# Patient Record
Sex: Male | Born: 1965 | Race: White | Hispanic: No | Marital: Married | Smoking: Never smoker
Health system: Southern US, Community
[De-identification: ages and names within clinical notes are randomized; demographics above are authoritative.]

## PROBLEM LIST (undated history)

## (undated) DIAGNOSIS — E785 Hyperlipidemia, unspecified: Secondary | ICD-10-CM

## (undated) HISTORY — DX: Hyperlipidemia, unspecified: E78.5

---

## 2013-09-30 ENCOUNTER — Encounter: Payer: Self-pay | Admitting: Family Medicine

## 2013-09-30 ENCOUNTER — Encounter (INDEPENDENT_AMBULATORY_CARE_PROVIDER_SITE_OTHER): Payer: Self-pay

## 2013-09-30 ENCOUNTER — Ambulatory Visit (INDEPENDENT_AMBULATORY_CARE_PROVIDER_SITE_OTHER): Payer: BC Managed Care – PPO | Admitting: Family Medicine

## 2013-09-30 VITALS — BP 125/85 | HR 60 | Ht 72.0 in | Wt 225.0 lb

## 2013-09-30 DIAGNOSIS — M545 Low back pain, unspecified: Secondary | ICD-10-CM

## 2013-09-30 NOTE — Patient Instructions (Signed)
You have low back pain. This can be from a variety of things - you have known L5-S1 degenerative disc disease.  It's possible you have a bulging disc as well. But these are all treated with the same conservative protocol. Consider prednisone if you develop numbness/tingling, radiation into legs. Aleve 2 tabs twice a day with food as needed. Consider flexeril as needed for muscle spasms (no driving on this medicine if it makes you sleepy). Stay as active as possible. Physical therapy has been shown to be helpful as well - start this and do home exercises on days you don't go to therapy. Strengthening of low back muscles, abdominal musculature are key for long term pain relief. If not improving, will consider further imaging (MRI).  Generic arthritis instructions: Take tylenol 500mg  1-2 tabs three times a day for pain. Aleve 1-2 tabs twice a day with food Glucosamine sulfate 750mg  twice a day is a supplement that may help. Capsaicin topically up to four times a day may also help with pain. Cortisone injections are an option. If cortisone injections do not help, there are different types of shots that may help but they take longer to take effect. It's important that you continue to stay active. Physical therapy as you're going to do. Shoe inserts with good arch support may be helpful. Heat or ice 15 minutes at a time 3-4 times a day as needed to help with pain. Follow up with me in 6 weeks for reevaluation.

## 2013-10-02 ENCOUNTER — Encounter: Payer: Self-pay | Admitting: Family Medicine

## 2013-10-02 DIAGNOSIS — M545 Low back pain, unspecified: Secondary | ICD-10-CM | POA: Insufficient documentation

## 2013-10-02 NOTE — Progress Notes (Signed)
Patient ID: Jeremy FriesKenneth Chung, male   DOB: 09-15-1965, 48 y.o.   MRN: [id number]  PCP: No primary provider on file.  Subjective:   HPI: Patient is a 48 y.o. male here for low back pain.  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 For years has had pain in low back without any trauma or injury. Has seen chiropractors in past - 5-6 years ago dx with sciatica and did well with this, improved for 2 years. Started coming back and chiropractic care didn't help. Is tolerable and has good and bad days. Had x-rays on 10/18/12 showing moderate DDD at L5-S1 level. Has tried icing, exercises, stretching, TENS unit at chiropractor, aleve. Never had MRI, done PT, ESIs, capsaicin.  Past Medical History  Diagnosis Date  . Hyperlipidemia     No current outpatient prescriptions on file prior to visit.   No current facility-administered medications on file prior to visit.    History reviewed. No pertinent past surgical history.  No Known Allergies  History   Social History  . Marital Status: Married    Spouse Name: N/A    Number of Children: N/A  . Years of Education: N/A   Occupational History  . Not on file.   Social History Main Topics  . Smoking status: Never Smoker   . Smokeless tobacco: Not on file  . Alcohol Use: Not on file  . Drug Use: Not on file  . Sexual Activity: Not on file   Other Topics Concern  . Not on file   Social History Narrative  . No narrative on file    Family History  Problem Relation Age of Onset  . Hyperlipidemia Father     BP 125/85  Pulse 60  Ht 6' (1.829 m)  Wt 225 lb (102.059 kg)  BMI 30.51 kg/m2  Review of Systems: See HPI above.    Objective:  Physical Exam:  Gen: NAD  Back: No gross deformity, scoliosis. No focal TTP .  No midline or bony TTP. ROM 10 degrees extension, 70 flexion. Strength LEs 5/5 all muscle groups.   2+ MSRs in patellar and achilles tendons, equal bilaterally. Negative  SLRs. Sensation intact to light touch bilaterally. Negative logroll bilateral hips Negative fabers and piriformis stretches.    Assessment & Plan:  1. Low back pain - known L5-S1 moderate DDD.  May have bulging disc also.  Discussed treatment options - will start with physical therapy and a home exercise program.  Consider further imaging (MRI) if not improving.  Aleve regularly.  Consider prednisone, flexeril, glucosamine, capsaicin.  F/u in 6 weeks.

## 2013-10-02 NOTE — Assessment & Plan Note (Signed)
known L5-S1 moderate DDD.  May have bulging disc also.  Discussed treatment options - will start with physical therapy and a home exercise program.  Consider further imaging (MRI) if not improving.  Aleve regularly.  Consider prednisone, flexeril, glucosamine, capsaicin.  F/u in 6 weeks.

## 2013-10-06 ENCOUNTER — Ambulatory Visit: Payer: BC Managed Care – PPO | Attending: Family Medicine | Admitting: Physical Therapy

## 2013-10-06 DIAGNOSIS — IMO0001 Reserved for inherently not codable concepts without codable children: Secondary | ICD-10-CM | POA: Insufficient documentation

## 2013-10-06 DIAGNOSIS — M545 Low back pain, unspecified: Secondary | ICD-10-CM | POA: Insufficient documentation

## 2013-10-12 ENCOUNTER — Ambulatory Visit: Payer: BC Managed Care – PPO | Admitting: Rehabilitation

## 2013-10-19 ENCOUNTER — Ambulatory Visit: Payer: BC Managed Care – PPO | Admitting: Physical Therapy

## 2013-10-21 ENCOUNTER — Ambulatory Visit: Payer: BC Managed Care – PPO | Admitting: Physical Therapy

## 2013-10-26 ENCOUNTER — Ambulatory Visit: Payer: BC Managed Care – PPO | Admitting: Physical Therapy

## 2013-10-28 ENCOUNTER — Ambulatory Visit: Payer: BC Managed Care – PPO | Admitting: Physical Therapy

## 2013-11-02 ENCOUNTER — Ambulatory Visit: Payer: BC Managed Care – PPO | Attending: Family Medicine | Admitting: Physical Therapy

## 2013-11-02 DIAGNOSIS — M545 Low back pain, unspecified: Secondary | ICD-10-CM | POA: Insufficient documentation

## 2013-11-04 ENCOUNTER — Ambulatory Visit: Payer: BC Managed Care – PPO | Admitting: Rehabilitation

## 2013-11-09 ENCOUNTER — Ambulatory Visit: Payer: BC Managed Care – PPO | Admitting: Physical Therapy

## 2013-11-11 ENCOUNTER — Ambulatory Visit: Payer: BC Managed Care – PPO | Admitting: Rehabilitation

## 2013-11-16 ENCOUNTER — Ambulatory Visit: Payer: BC Managed Care – PPO | Admitting: Physical Therapy

## 2013-11-18 ENCOUNTER — Ambulatory Visit: Payer: BC Managed Care – PPO | Admitting: Rehabilitation

## 2013-11-18 ENCOUNTER — Ambulatory Visit: Payer: BC Managed Care – PPO | Admitting: Family Medicine

## 2013-11-23 ENCOUNTER — Ambulatory Visit: Payer: BC Managed Care – PPO | Admitting: Physical Therapy

## 2013-11-23 ENCOUNTER — Ambulatory Visit: Payer: BC Managed Care – PPO | Admitting: Family Medicine

## 2013-11-30 ENCOUNTER — Ambulatory Visit (INDEPENDENT_AMBULATORY_CARE_PROVIDER_SITE_OTHER): Payer: BC Managed Care – PPO | Admitting: Family Medicine

## 2013-11-30 ENCOUNTER — Ambulatory Visit: Payer: BC Managed Care – PPO | Admitting: Physical Therapy

## 2013-11-30 ENCOUNTER — Encounter: Payer: Self-pay | Admitting: Family Medicine

## 2013-11-30 VITALS — BP 125/83 | HR 67 | Ht 72.0 in | Wt 210.0 lb

## 2013-11-30 DIAGNOSIS — M545 Low back pain, unspecified: Secondary | ICD-10-CM

## 2013-12-01 ENCOUNTER — Encounter: Payer: Self-pay | Admitting: Family Medicine

## 2013-12-01 NOTE — Progress Notes (Addendum)
Patient ID: Jeremy Chung, male   DOB: 1966/06/21, 48 y.o.   MRN: [id number]  PCP: No primary provider on file.  Subjective:   HPI: Patient is a 48 y.o. male here for low back pain.  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 For years has had pain in low back without any trauma or injury. Has seen chiropractors in past - 5-6 years ago dx with sciatica and did well with this, improved for 2 years. Started coming back and chiropractic care didn't help. Is tolerable and has good and bad days. Had x-rays on 10/18/12 showing moderate DDD at L5-S1 level. Has tried icing, exercises, stretching, TENS unit at chiropractor, aleve. Never had MRI, done PT, ESIs, capsaicin.  3/31: Patient states he does feel some better since last visit. Has done PT, HEP regularly. Still bothers him mainly when in same position for prolonged periods, with extension. Does not feel as locked up. Gets some radiation into right leg at times. No bowel/bladder dysfunction.  Past Medical History  Diagnosis Date  . Hyperlipidemia     Current Outpatient Prescriptions on File Prior to Visit  Medication Sig Dispense Refill  . lansoprazole (PREVACID) 30 MG capsule       . simvastatin (ZOCOR) 40 MG tablet        No current facility-administered medications on file prior to visit.    History reviewed. No pertinent past surgical history.  No Known Allergies  History   Social History  . Marital Status: Married    Spouse Name: N/A    Number of Children: N/A  . Years of Education: N/A   Occupational History  . Not on file.   Social History Main Topics  . Smoking status: Never Smoker   . Smokeless tobacco: Not on file  . Alcohol Use: Not on file  . Drug Use: Not on file  . Sexual Activity: Not on file   Other Topics Concern  . Not on file   Social History Narrative  . No narrative on file    Family History  Problem Relation Age of Onset  . Hyperlipidemia  Father     BP 125/83  Pulse 67  Ht 6' (1.829 m)  Wt 210 lb (95.255 kg)  BMI 28.47 kg/m2  Review of Systems: See HPI above.    Objective:  Physical Exam:  Gen: NAD  Back: No gross deformity, scoliosis. No focal TTP .  No midline or bony TTP. FROM with mild pain on extension. Strength LEs 5/5 all muscle groups.   2+ MSRs in patellar and achilles tendons, equal bilaterally. Negative SLRs. Sensation intact to light touch bilaterally. Negative logroll bilateral hips    Assessment & Plan:  1. Low back pain - known L5-S1 moderate DDD.  Improvement has plateaued with PT and HEP.  Will move forward with MRI to assess for disc bulge or herniation, radiculopathy into right leg.  Continue HEP with aleve if needed.    Addendum:  MRI reviewed and discussed with patient - he has severe stenosis bilaterally at L5-S1 with compression of L5 nerves based on MRI - due to spondylolisthesis.  Overall as he's continued with HEP he's not interested in pursuing ESIs, neurosurgery referral and pain not bad enough to consider these per patient.  He will call us if he would like to pursue these or if interested in repeating PT.

## 2013-12-01 NOTE — Assessment & Plan Note (Signed)
known L5-S1 moderate DDD.  Improvement has plateaued with PT and HEP.  Will move forward with MRI to assess for disc bulge or herniation, radiculopathy into right leg.  Continue HEP with aleve if needed.

## 2013-12-14 ENCOUNTER — Ambulatory Visit (HOSPITAL_BASED_OUTPATIENT_CLINIC_OR_DEPARTMENT_OTHER)
Admission: RE | Admit: 2013-12-14 | Discharge: 2013-12-14 | Disposition: A | Discharge status: Home / Self Care | Payer: BC Managed Care – PPO | Source: Ambulatory Visit | Attending: Family Medicine | Admitting: Family Medicine

## 2013-12-14 DIAGNOSIS — M5137 Other intervertebral disc degeneration, lumbosacral region: Secondary | ICD-10-CM | POA: Insufficient documentation

## 2013-12-14 DIAGNOSIS — M545 Low back pain, unspecified: Secondary | ICD-10-CM

## 2013-12-14 DIAGNOSIS — R209 Unspecified disturbances of skin sensation: Secondary | ICD-10-CM | POA: Insufficient documentation

## 2013-12-14 DIAGNOSIS — M51379 Other intervertebral disc degeneration, lumbosacral region without mention of lumbar back pain or lower extremity pain: Secondary | ICD-10-CM | POA: Insufficient documentation

## 2013-12-14 DIAGNOSIS — G8929 Other chronic pain: Secondary | ICD-10-CM | POA: Insufficient documentation

## 2013-12-14 DIAGNOSIS — M4310 Spondylolisthesis, site unspecified: Secondary | ICD-10-CM | POA: Insufficient documentation

## 2013-12-14 DIAGNOSIS — M48061 Spinal stenosis, lumbar region without neurogenic claudication: Secondary | ICD-10-CM | POA: Insufficient documentation

## 2014-02-16 ENCOUNTER — Telehealth: Payer: Self-pay | Admitting: Family Medicine

## 2014-02-17 NOTE — Telephone Encounter (Signed)
Did he mean the low back?  That's what we had been seeing him for.  If that's the case, yes it's ok to go ahead with referral for bilateral L5-S1 ESIs.  Haywood LassoLynette - please make sure it's his back and not the neck.  If it is his back, forward this phone note to Gunnar Fusiaula so she can get it set up.  Thanks!

## 2014-02-21 ENCOUNTER — Other Ambulatory Visit: Payer: Self-pay | Admitting: Family Medicine

## 2014-02-21 DIAGNOSIS — M545 Low back pain: Secondary | ICD-10-CM

## 2014-02-23 ENCOUNTER — Other Ambulatory Visit: Payer: Self-pay | Admitting: Family Medicine

## 2014-02-23 ENCOUNTER — Ambulatory Visit
Admission: RE | Admit: 2014-02-23 | Discharge: 2014-02-23 | Disposition: A | Discharge status: Home / Self Care | Payer: BC Managed Care – PPO | Source: Ambulatory Visit | Attending: Family Medicine | Admitting: Family Medicine

## 2014-02-23 DIAGNOSIS — M545 Low back pain: Secondary | ICD-10-CM

## 2014-02-23 MED ORDER — METHYLPREDNISOLONE ACETATE 40 MG/ML INJ SUSP (RADIOLOG
120.0000 mg | Freq: Once | INTRAMUSCULAR | Status: AC
  Administered 2014-02-23: 120 mg via EPIDURAL

## 2014-02-23 MED ORDER — IOHEXOL 180 MG/ML  SOLN
1.0000 mL | Freq: Once | INTRAMUSCULAR | Status: AC | PRN
  Administered 2014-02-23: 1 mL via EPIDURAL

## 2014-02-23 NOTE — Discharge Instructions (Signed)

## 2015-12-13 ENCOUNTER — Telehealth: Payer: Self-pay | Admitting: Family Medicine

## 2015-12-13 NOTE — Telephone Encounter (Signed)
This is ok with me to send in addition to this.  Thanks!

## 2015-12-13 NOTE — Telephone Encounter (Signed)
Unable to contact patient to see which location to send this to.

## 2015-12-18 NOTE — Telephone Encounter (Signed)
Was given location information. Order sent.

## 2016-01-04 ENCOUNTER — Ambulatory Visit (HOSPITAL_BASED_OUTPATIENT_CLINIC_OR_DEPARTMENT_OTHER)
Admission: RE | Admit: 2016-01-04 | Discharge: 2016-01-04 | Disposition: A | Discharge status: Home / Self Care | Payer: BLUE CROSS/BLUE SHIELD | Source: Ambulatory Visit | Attending: Family Medicine | Admitting: Family Medicine

## 2016-01-04 ENCOUNTER — Encounter: Payer: Self-pay | Admitting: Family Medicine

## 2016-01-04 ENCOUNTER — Ambulatory Visit (INDEPENDENT_AMBULATORY_CARE_PROVIDER_SITE_OTHER): Payer: BLUE CROSS/BLUE SHIELD | Admitting: Family Medicine

## 2016-01-04 VITALS — BP 116/70 | HR 70 | Ht 72.0 in | Wt 205.0 lb

## 2016-01-04 DIAGNOSIS — M4317 Spondylolisthesis, lumbosacral region: Secondary | ICD-10-CM | POA: Diagnosis not present

## 2016-01-04 DIAGNOSIS — M4807 Spinal stenosis, lumbosacral region: Secondary | ICD-10-CM | POA: Diagnosis not present

## 2016-01-04 DIAGNOSIS — M5440 Lumbago with sciatica, unspecified side: Secondary | ICD-10-CM | POA: Insufficient documentation

## 2016-01-04 DIAGNOSIS — M545 Low back pain: Secondary | ICD-10-CM | POA: Diagnosis present

## 2016-01-04 MED ORDER — PREDNISONE 10 MG PO TABS: ORAL_TABLET | ORAL | Status: AC

## 2016-01-04 MED FILL — predniSONE 10 MG TABS: 10 | 6 days supply | Qty: 21 | Fill #0

## 2016-01-04 NOTE — Progress Notes (Addendum)
PCP: MCFADDEN,JOHN C, MD  Subjective:   HPI: Patient is a 50 y.o. male here for low back pain.  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 For years has had pain in low back without any trauma or injury. Has seen chiropractors in past - 5-6 years ago dx with sciatica and did well with this, improved for 2 years. Started coming back and chiropractic care didn't help. Is tolerable and has good and bad days. Had x-rays on 10/18/12 showing moderate DDD at L5-S1 level. Has tried icing, exercises, stretching, TENS unit at chiropractor, aleve. Never had MRI, done PT, ESIs, capsaicin.  11/30/13: Patient states he does feel some better since last visit. Has done PT, HEP regularly. Still bothers him mainly when in same position for prolonged periods, with extension. Does not feel as locked up. Gets some radiation into right leg at times. No bowel/bladder dysfunction.  01/04/16: Patient reports he started to get worsening pain in low back about 6 weeks ago. Played 8 games of softball in a weekend and pain in low back worsened. With radiation into both legs, calves. Low back is locking up. Pain is 6/10 at rest, sharp. No bowel/bladder dysfunction. Pain is worse on right than left. Tried chiropractic care, doing physical therapy and dry needling.  Past Medical History  Diagnosis Date  . Hyperlipidemia     Current Outpatient Prescriptions on File Prior to Visit  Medication Sig Dispense Refill  . lansoprazole (PREVACID) 30 MG capsule      No current facility-administered medications on file prior to visit.    No past surgical history on file.  No Known Allergies  Social History   Social History  . Marital Status: Married    Spouse Name: N/A  . Number of Children: N/A  . Years of Education: N/A   Occupational History  . Not on file.   Social History Main Topics  . Smoking status: Never Smoker   . Smokeless tobacco: Not on file  .  Alcohol Use: Not on file  . Drug Use: Not on file  . Sexual Activity: Not on file   Other Topics Concern  . Not on file   Social History Narrative    Family History  Problem Relation Age of Onset  . Hyperlipidemia Father     BP 116/70 mmHg  Pulse 70  Ht 6' (1.829 m)  Wt 205 lb (92.987 kg)  BMI 27.80 kg/m2  Review of Systems: See HPI above.    Objective:  Physical Exam:  Gen: NAD, comfortable in exam room  Back: No gross deformity, scoliosis. No TTP.  No midline or bony TTP. FROM. Strength LEs 5/5 all muscle groups.   2+ MSRs in patellar and achilles tendons, equal bilaterally. Negative SLRs. Sensation intact to light touch bilaterally. Negative logroll bilateral hips Negative fabers and piriformis stretches.    Assessment & Plan:  1. Low back pain - exam reassuring today.  We will repeat his radiographs.  Consistent with his known spinal stenosis and radiculopathy - question if this has progressed since imaging 2 years ago.  He will start a prednisone dose pack, continue with physical therapy.  Call us in 1-2 weeks to let us know how he's doing.  NSAIDs as needed.  Consider repeating his MRI, ESI if not improving.  Addendum:  Radiographs reviewed and discussed with patient.  Overall he is improving with prednisone dose pack.  MRI from 12/14/13 read as 9mm spondy at L5-S1 with  severe foraminal stenosis bilaterally.  Current radiographs read at 21mm by radiology.  On my measurement I get 9mm from MRI 12/14/13 and 12mm on today's radiographs.  We discussed options - recommended monitoring, repeating radiographs at 6 month intervals to assess for progression.  Call us in 1-2 weeks with an update on his status.  We can consider repeating his MRI, ESIs if not improving.  Addendum:  MRI reviewed and discussed with patient.  Appears largely unchanged - believe the 12mm anterolisthesis is within margin of error compared to 2 years ago.  Has L5 nerve root encroachment bilaterally and  still struggling with pain.  Discussed options - will go ahead with ESIs bilaterally for this.  Advised him to call us in a week after this to let us know how he's doing.

## 2016-01-04 NOTE — Patient Instructions (Signed)
Get x-rays downstairs as you leave today - we will call you with the results. Take prednisone dose pack as directed for 6 days. Continue with physical therapy. Call me in a week or two to let me know how you're doing. If not improving I'd consider an epidural steroid injection.

## 2016-01-05 NOTE — Assessment & Plan Note (Signed)
exam reassuring today.  We will repeat his radiographs.  Consistent with his known spinal stenosis and radiculopathy - question if this has progressed since imaging 2 years ago.  He will start a prednisone dose pack, continue with physical therapy.  Call us in 1-2 weeks to let us know how he's doing.  NSAIDs as needed.  Consider repeating his MRI, ESI if not improving.

## 2016-01-16 ENCOUNTER — Telehealth: Payer: Self-pay | Admitting: Family Medicine

## 2016-01-16 NOTE — Telephone Encounter (Signed)
Status after steroids:  He felt great first day but it has been downhill ever since.  What should he do next?, next step?  Please call patient, thanks

## 2016-01-16 NOTE — Telephone Encounter (Signed)
Spoke to patient and he would like to do MRI. Will set up for patient.

## 2016-01-16 NOTE — Telephone Encounter (Signed)
We discussed one of two options:  Repeating his MRI (I think I would do this first), doing an epidural steroid injection.

## 2016-01-17 NOTE — Telephone Encounter (Signed)
Will set up MRI.

## 2016-01-22 NOTE — Addendum Note (Signed)
Addended by: Kathi SimpersWISE, Agusta Hackenberg F on: 01/22/2016 02:13 PM   Modules accepted: Orders

## 2016-01-23 ENCOUNTER — Telehealth: Payer: Self-pay | Admitting: Family Medicine

## 2016-01-23 NOTE — Telephone Encounter (Signed)
Spoke to patient and told him that someone from radiology at [facility] would call and schedule appointment for MRI.

## 2016-01-30 ENCOUNTER — Ambulatory Visit (INDEPENDENT_AMBULATORY_CARE_PROVIDER_SITE_OTHER): Payer: BLUE CROSS/BLUE SHIELD

## 2016-01-30 DIAGNOSIS — M5440 Lumbago with sciatica, unspecified side: Secondary | ICD-10-CM

## 2016-01-30 DIAGNOSIS — M4317 Spondylolisthesis, lumbosacral region: Secondary | ICD-10-CM

## 2016-02-01 ENCOUNTER — Other Ambulatory Visit: Payer: Self-pay | Admitting: Family Medicine

## 2016-02-01 DIAGNOSIS — M5416 Radiculopathy, lumbar region: Secondary | ICD-10-CM

## 2016-02-06 ENCOUNTER — Ambulatory Visit
Admission: RE | Admit: 2016-02-06 | Discharge: 2016-02-06 | Disposition: A | Discharge status: Home / Self Care | Payer: BLUE CROSS/BLUE SHIELD | Source: Ambulatory Visit | Attending: Family Medicine | Admitting: Family Medicine

## 2016-02-06 DIAGNOSIS — M5416 Radiculopathy, lumbar region: Secondary | ICD-10-CM

## 2016-02-06 MED ORDER — METHYLPREDNISOLONE ACETATE 40 MG/ML INJ SUSP (RADIOLOG
120.0000 mg | Freq: Once | INTRAMUSCULAR | Status: AC
  Administered 2016-02-06: 120 mg via EPIDURAL

## 2016-02-06 MED ORDER — IOPAMIDOL (ISOVUE-M 200) INJECTION 41%
1.0000 mL | Freq: Once | INTRAMUSCULAR | Status: AC
  Administered 2016-02-06: 1 mL via EPIDURAL

## 2016-02-06 NOTE — Discharge Instructions (Signed)

## 2016-02-13 ENCOUNTER — Telehealth: Payer: Self-pay | Admitting: Family Medicine

## 2016-02-13 NOTE — Telephone Encounter (Signed)
Spoke to patient and told him that he could come by the radiology department and they would have his CD ready for him.

## 2016-02-13 NOTE — Telephone Encounter (Signed)
That's usually up to the chiropractor but I believe they would want to see them.  If so please call to radiology and ask them to put these things on a disc for patient.  Thanks!

## 2016-06-01 DIAGNOSIS — K219 Gastro-esophageal reflux disease without esophagitis: Secondary | ICD-10-CM | POA: Insufficient documentation

## 2016-06-01 DIAGNOSIS — K760 Fatty (change of) liver, not elsewhere classified: Secondary | ICD-10-CM | POA: Insufficient documentation

## 2016-06-01 DIAGNOSIS — E7800 Pure hypercholesterolemia, unspecified: Secondary | ICD-10-CM | POA: Insufficient documentation

## 2016-06-01 DIAGNOSIS — N529 Male erectile dysfunction, unspecified: Secondary | ICD-10-CM | POA: Insufficient documentation

## 2016-06-01 DIAGNOSIS — Z87438 Personal history of other diseases of male genital organs: Secondary | ICD-10-CM | POA: Insufficient documentation

## 2016-06-01 DIAGNOSIS — Z87891 Personal history of nicotine dependence: Secondary | ICD-10-CM | POA: Insufficient documentation

## 2016-06-01 DIAGNOSIS — K2971 Gastritis, unspecified, with bleeding: Secondary | ICD-10-CM | POA: Insufficient documentation

## 2016-06-24 DIAGNOSIS — D649 Anemia, unspecified: Secondary | ICD-10-CM | POA: Insufficient documentation

## 2017-04-02 ENCOUNTER — Telehealth: Payer: Self-pay | Admitting: Family Medicine

## 2017-04-02 NOTE — Telephone Encounter (Signed)
It's been over a year since I've seen him - we'd need to see him for an appointment.  Thanks!

## 2017-04-02 NOTE — Telephone Encounter (Signed)
Patient calling wanting to discuss having [facility]. Patient requesting a call back

## 2017-04-03 NOTE — Telephone Encounter (Signed)
Scheduled patient for 8/3

## 2017-04-04 ENCOUNTER — Encounter: Payer: Self-pay | Admitting: Family Medicine

## 2017-04-04 ENCOUNTER — Ambulatory Visit (INDEPENDENT_AMBULATORY_CARE_PROVIDER_SITE_OTHER): Payer: BLUE CROSS/BLUE SHIELD | Admitting: Family Medicine

## 2017-04-04 DIAGNOSIS — G8929 Other chronic pain: Secondary | ICD-10-CM | POA: Diagnosis not present

## 2017-04-04 DIAGNOSIS — M5441 Lumbago with sciatica, right side: Secondary | ICD-10-CM | POA: Diagnosis not present

## 2017-04-04 NOTE — Patient Instructions (Signed)
We will go ahead with a repeat epidural steroid injection. We will refer you to neurosurgery also for evaluation, to talk about surgical options, answer questions related to this - we will leave it up to them regarding repeating the MRI.

## 2017-04-07 ENCOUNTER — Telehealth: Payer: Self-pay | Admitting: Family Medicine

## 2017-04-07 NOTE — Telephone Encounter (Signed)
Patient calling back with an update. Did not want to disclose information. Requested a call back from nurse to discuss information.

## 2017-04-08 NOTE — Progress Notes (Signed)
PCP: Lester [place]., MD  Subjective:   HPI: Patient is a 51 y.o. male here for low back pain.  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 For years has had pain in low back without any trauma or injury. Has seen chiropractors in past - 5-6 years ago dx with sciatica and did well with this, improved for 2 years. Started coming back and chiropractic care didn't help. Is tolerable and has good and bad days. Had x-rays on 10/18/12 showing moderate DDD at L5-S1 level. Has tried icing, exercises, stretching, TENS unit at chiropractor, aleve. Never had MRI, done PT, ESIs, capsaicin.  11/30/13: Patient states he does feel some better since last visit. Has done PT, HEP regularly. Still bothers him mainly when in same position for prolonged periods, with extension. Does not feel as locked up. Gets some radiation into right leg at times. No bowel/bladder dysfunction.  01/04/16: Patient reports he started to get worsening pain in low back about 6 weeks ago. Played 8 games of softball in a weekend and pain in low back worsened. With radiation into both legs, calves. Low back is locking up. Pain is 6/10 at rest, sharp. No bowel/bladder dysfunction. Pain is worse on right than left. Tried chiropractic care, doing physical therapy and dry needling.  04/04/17: Patient reports past 2-3 months pain has worsened in his low back on right side. Pain up to 7/10 and sharp. Worse with twisting. Tried chiropractic care, laser.  Chiropractor helped some. Pain goes into right groin some. Ice helps. No bowel/bladder dysfunction.  Past Medical History:  Diagnosis Date  . Hyperlipidemia     Current Outpatient Prescriptions on File Prior to Visit  Medication Sig Dispense Refill  . lansoprazole (PREVACID) 30 MG capsule     . pravastatin (PRAVACHOL) 20 MG tablet   0  . predniSONE (DELTASONE) 10 MG tablet 6 tabs po day 1, 5 tabs po day 2, 4 tabs po day 3, 3  tabs po day 4, 2 tabs po day 5, 1 tab po day 6 21 tablet 0   No current facility-administered medications on file prior to visit.     No past surgical history on file.  Allergies  Allergen Reactions  . Simvastatin Other (See Comments)    Elevated liver enzymes     Social History   Social History  . Marital status: Married    Spouse name: N/A  . Number of children: N/A  . Years of education: N/A   Occupational History  . Not on file.   Social History Main Topics  . Smoking status: Never Smoker  . Smokeless tobacco: Never Used  . Alcohol use Not on file  . Drug use: Unknown  . Sexual activity: Not on file   Other Topics Concern  . Not on file   Social History Narrative  . No narrative on file    Family History  Problem Relation Age of Onset  . Hyperlipidemia Father     BP 119/80   Pulse 67   Ht 6' (1.829 m)   Wt 210 lb (95.3 kg)   BMI 28.48 kg/m   Review of Systems: See HPI above.    Objective:  Physical Exam:  Gen: NAD, comfortable in exam room  Back: No gross deformity, scoliosis. No TTP.  No midline or bony TTP. FROM with pain on flexion > extension. Strength LEs 5/5 all muscle groups.   2+ MSRs in patellar and achilles tendons, equal bilaterally.  Negative SLRs. Sensation intact to light touch bilaterally. Negative logroll bilateral hips Negative fabers and piriformis stretches.    Assessment & Plan:  1. Low back pain - 2/2 L5-S1 anterolisthesis with bilateral foraminal stenosis confirmed by MRI.  He's continued to struggle with this over past few years despite PT, home exercises trial of prednisone, ESIs.  He would like to go ahead with repeat ESI for now but also neurosurgery referral to discuss options, recovery.  Ibuprofen or aleve if needed.

## 2017-04-08 NOTE — Telephone Encounter (Signed)
Spoke to patient, who gave  updated information.

## 2017-04-08 NOTE — Assessment & Plan Note (Signed)
2/2 L5-S1 anterolisthesis with bilateral foraminal stenosis confirmed by MRI.  He's continued to struggle with this over past few years despite PT, home exercises trial of prednisone, ESIs.  He would like to go ahead with repeat ESI for now but also neurosurgery referral to discuss options, recovery.  Ibuprofen or aleve if needed.

## 2017-04-14 ENCOUNTER — Other Ambulatory Visit: Payer: Self-pay | Admitting: Family Medicine

## 2017-04-14 DIAGNOSIS — M545 Low back pain: Principal | ICD-10-CM

## 2017-04-14 DIAGNOSIS — G8929 Other chronic pain: Secondary | ICD-10-CM

## 2017-04-24 ENCOUNTER — Ambulatory Visit
Admission: RE | Admit: 2017-04-24 | Discharge: 2017-04-24 | Disposition: A | Discharge status: Home / Self Care | Payer: BLUE CROSS/BLUE SHIELD | Source: Ambulatory Visit | Attending: Family Medicine | Admitting: Family Medicine

## 2017-04-24 DIAGNOSIS — G8929 Other chronic pain: Secondary | ICD-10-CM

## 2017-04-24 DIAGNOSIS — M545 Low back pain: Principal | ICD-10-CM

## 2017-04-24 MED ORDER — IOPAMIDOL (ISOVUE-M 200) INJECTION 41%
1.0000 mL | Freq: Once | INTRAMUSCULAR | Status: AC
  Administered 2017-04-24: 1 mL via EPIDURAL

## 2017-04-24 MED ORDER — METHYLPREDNISOLONE ACETATE 40 MG/ML INJ SUSP (RADIOLOG
120.0000 mg | Freq: Once | INTRAMUSCULAR | Status: AC
  Administered 2017-04-24: 120 mg via EPIDURAL

## 2017-04-24 NOTE — Discharge Instructions (Signed)

## 2017-11-25 ENCOUNTER — Telehealth: Payer: Self-pay | Admitting: Family Medicine

## 2017-11-25 NOTE — Telephone Encounter (Signed)
[facility]

## 2017-11-25 NOTE — Telephone Encounter (Signed)
Patient does not want to make another appointment. Is going to check other ways to get an order placed.

## 2017-11-25 NOTE — Telephone Encounter (Signed)
We need to see him before going ahead with that since it's been 8 months since his last visit.

## 2017-12-12 ENCOUNTER — Other Ambulatory Visit: Payer: Self-pay | Admitting: Student

## 2017-12-12 DIAGNOSIS — M4317 Spondylolisthesis, lumbosacral region: Secondary | ICD-10-CM

## 2017-12-25 ENCOUNTER — Ambulatory Visit
Admission: RE | Admit: 2017-12-25 | Discharge: 2017-12-25 | Disposition: A | Discharge status: Home / Self Care | Payer: BLUE CROSS/BLUE SHIELD | Source: Ambulatory Visit | Attending: Student | Admitting: Student

## 2017-12-25 DIAGNOSIS — M4317 Spondylolisthesis, lumbosacral region: Secondary | ICD-10-CM

## 2017-12-25 IMAGING — XA DG EPIDURAL/NERVE ROOT
2 series · 2 of 2 positions shown · non-contrast
Comparison: none

CLINICAL DATA: Lumbosacral spondylosis without myelopathy.
Excellent response to the prior transforaminal injection. Pain is
now beginning to return and involves the right lower extremity and
groin.

[Series 1: ortho standard · 1 of 1 slices shown (1 of 2)]
[im 1/1]
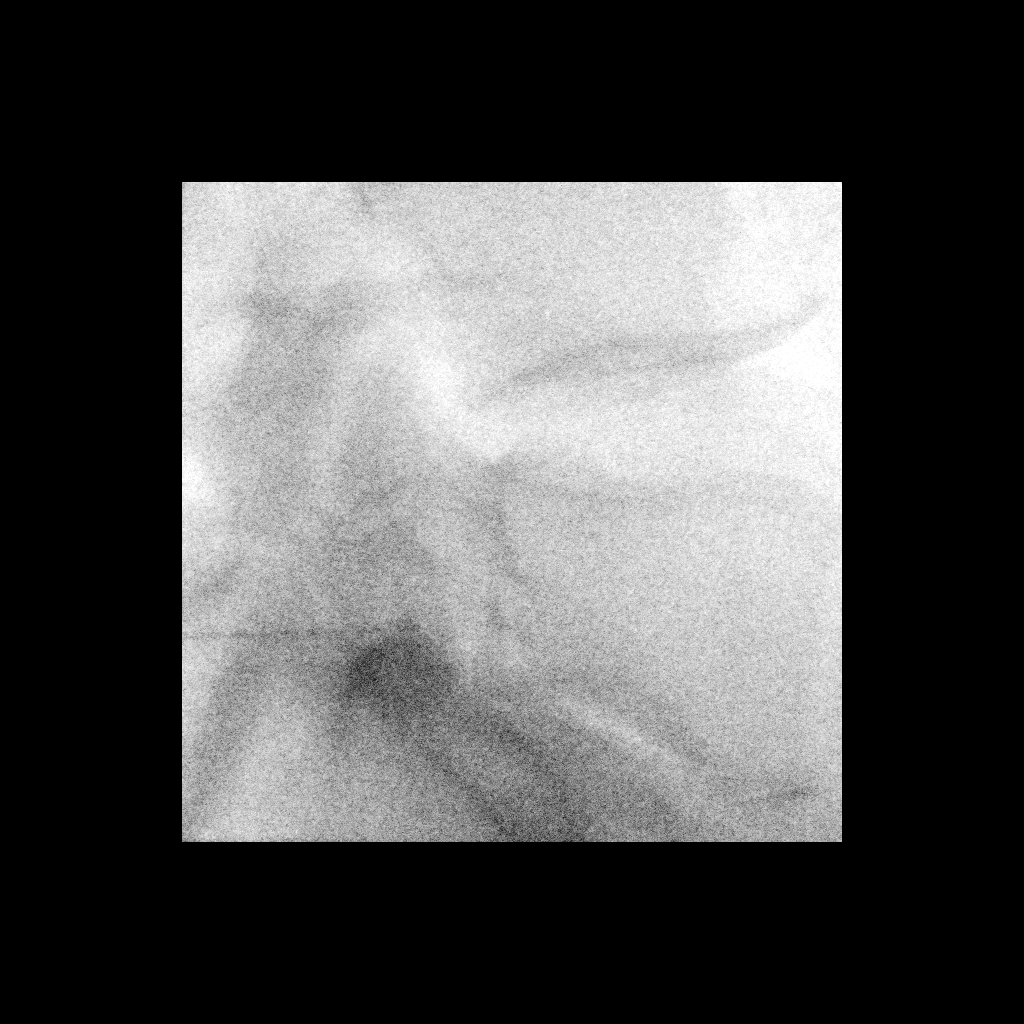

[Series 2: ortho standard · 1 of 1 slices shown (2 of 2)]
[im 1/1]
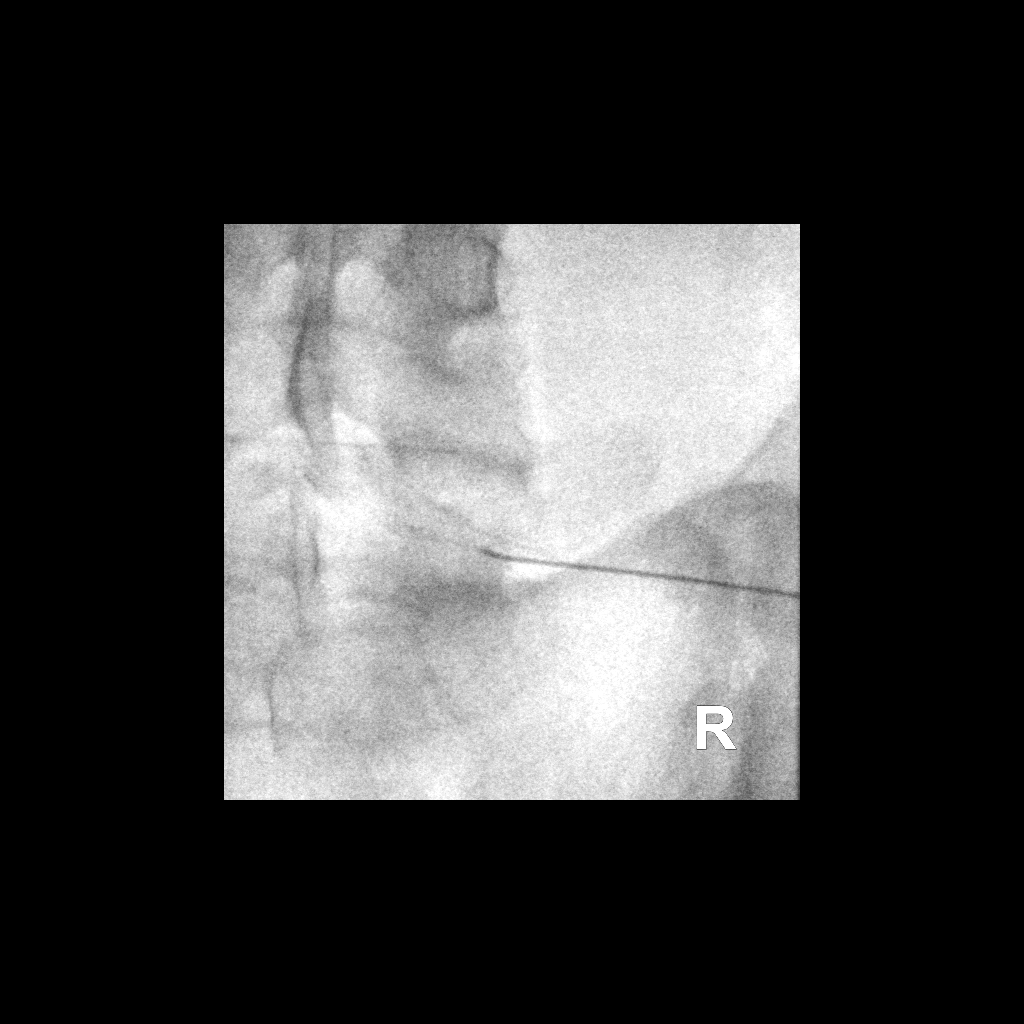

[2 of 2 positions shown; findings below may reference images not displayed]

EXAM:
EPIDURAL/NERVE ROOT

FLUOROSCOPY TIME:  Radiation Exposure Index (as provided by the
fluoroscopic device): 32.14 microGray*m^2

Fluoroscopy Time (in minutes and seconds):  16 seconds

PROCEDURE:
The procedure, risks, benefits, and alternatives were explained to
the patient. Questions regarding the procedure were encouraged and
answered. The patient understands and consents to the procedure.

RIGHT L5 NERVE ROOT BLOCK AND TRANSFORAMINAL EPIDURAL: A posterior
oblique approach was taken to the intervertebral foramen on the
right at L5-S1 using a curved 5 inch 22 gauge spinal needle (a
inch needle would have been adequate). Injection of Isovue-M 200
outlined the right L5 nerve root and showed good epidural spread. No
vascular opacification is seen. 120 mg of Depo-Medrol mixed with
mL of 1% lidocaine were instilled. The procedure was well-tolerated,
and the patient was discharged thirty minutes following the
injection in good condition.

COMPLICATIONS:
None
IMPRESSION: Technically successful injection consisting of a right L5 nerve root
block and transforaminal epidural.

## 2017-12-25 MED ORDER — METHYLPREDNISOLONE ACETATE 40 MG/ML INJ SUSP (RADIOLOG
120.0000 mg | Freq: Once | INTRAMUSCULAR | Status: AC
  Administered 2017-12-25: 120 mg via EPIDURAL

## 2017-12-25 MED ORDER — IOPAMIDOL (ISOVUE-M 200) INJECTION 41%
1.0000 mL | Freq: Once | INTRAMUSCULAR | Status: AC
  Administered 2017-12-25: 1 mL via EPIDURAL

## 2017-12-25 NOTE — Discharge Instructions (Signed)

## 2018-03-17 ENCOUNTER — Other Ambulatory Visit: Payer: Self-pay | Admitting: Neurosurgery

## 2018-03-17 DIAGNOSIS — G971 Other reaction to spinal and lumbar puncture: Secondary | ICD-10-CM
